# Patient Record
Sex: Male | Born: 1995 | Race: White | Hispanic: No | Marital: Single | Smoking: Never smoker
Health system: Southern US, Community
[De-identification: ages and names within clinical notes are randomized; demographics above are authoritative.]

## PROBLEM LIST (undated history)

## (undated) DIAGNOSIS — K769 Liver disease, unspecified: Secondary | ICD-10-CM

## (undated) DIAGNOSIS — I63511 Cerebral infarction due to unspecified occlusion or stenosis of right middle cerebral artery: Secondary | ICD-10-CM

## (undated) DIAGNOSIS — Q224 Congenital tricuspid stenosis: Secondary | ICD-10-CM

## (undated) DIAGNOSIS — I629 Nontraumatic intracranial hemorrhage, unspecified: Secondary | ICD-10-CM

## (undated) DIAGNOSIS — Z7682 Awaiting organ transplant status: Secondary | ICD-10-CM

## (undated) DIAGNOSIS — Q248 Other specified congenital malformations of heart: Secondary | ICD-10-CM

## (undated) DIAGNOSIS — N186 End stage renal disease: Secondary | ICD-10-CM

## (undated) DIAGNOSIS — D649 Anemia, unspecified: Secondary | ICD-10-CM

## (undated) DIAGNOSIS — Z950 Presence of cardiac pacemaker: Secondary | ICD-10-CM

## (undated) DIAGNOSIS — I495 Sick sinus syndrome: Secondary | ICD-10-CM

## (undated) DIAGNOSIS — I129 Hypertensive chronic kidney disease with stage 1 through stage 4 chronic kidney disease, or unspecified chronic kidney disease: Secondary | ICD-10-CM

## (undated) DIAGNOSIS — R1310 Dysphagia, unspecified: Secondary | ICD-10-CM

## (undated) HISTORY — PX: KIDNEY TRANSPLANT: SHX239

## (undated) HISTORY — PX: APPENDECTOMY: SHX54

---

## 2013-08-30 ENCOUNTER — Ambulatory Visit: Payer: Self-pay | Admitting: Family Medicine

## 2020-03-03 ENCOUNTER — Other Ambulatory Visit: Payer: Self-pay

## 2020-03-03 ENCOUNTER — Encounter: Payer: Self-pay | Admitting: Emergency Medicine

## 2020-03-03 ENCOUNTER — Ambulatory Visit (INDEPENDENT_AMBULATORY_CARE_PROVIDER_SITE_OTHER): Payer: Medicare Other

## 2020-03-03 ENCOUNTER — Ambulatory Visit
Admission: EM | Admit: 2020-03-03 | Discharge: 2020-03-03 | Disposition: A | Discharge status: Home / Self Care | Payer: Medicare Other | Attending: Emergency Medicine | Admitting: Emergency Medicine

## 2020-03-03 DIAGNOSIS — S66912A Strain of unspecified muscle, fascia and tendon at wrist and hand level, left hand, initial encounter: Secondary | ICD-10-CM

## 2020-03-03 DIAGNOSIS — IMO0001 Reserved for inherently not codable concepts without codable children: Secondary | ICD-10-CM

## 2020-03-03 HISTORY — DX: Cerebral infarction due to unspecified occlusion or stenosis of right middle cerebral artery: I63.511

## 2020-03-03 HISTORY — DX: Presence of cardiac pacemaker: Z95.0

## 2020-03-03 HISTORY — DX: Congenital tricuspid stenosis: Q22.4

## 2020-03-03 HISTORY — DX: Dysphagia, unspecified: R13.10

## 2020-03-03 HISTORY — DX: Hypertensive chronic kidney disease with stage 1 through stage 4 chronic kidney disease, or unspecified chronic kidney disease: I12.9

## 2020-03-03 HISTORY — DX: Anemia, unspecified: D64.9

## 2020-03-03 HISTORY — DX: Nontraumatic intracranial hemorrhage, unspecified: I62.9

## 2020-03-03 HISTORY — DX: End stage renal disease: N18.6

## 2020-03-03 HISTORY — DX: Liver disease, unspecified: K76.9

## 2020-03-03 HISTORY — DX: Awaiting organ transplant status: Z76.82

## 2020-03-03 HISTORY — DX: Other specified congenital malformations of heart: Q24.8

## 2020-03-03 HISTORY — DX: Sick sinus syndrome: I49.5

## 2020-03-03 NOTE — ED Triage Notes (Signed)
Patient states that he fell off his hoverboard yesterday and injured his left 4th finger.  Patient has swelling, bruising and tenderness in his left fourth finger.

## 2020-03-03 NOTE — Discharge Instructions (Addendum)
Apply ice 20 minutes out of every 2 hours 4-5 times daily for comfort.  Elevate your hand above the level of your heart sufficiently to control swelling and pain.  If you are not improving in a week or 2 follow-up at  [facility]

## 2022-01-25 IMAGING — CR DG FINGER RING 2+V*L*
3 series · 3 of 3 positions shown · non-contrast
Comparison: None.

CLINICAL DATA: Pain and swelling involving the left fourth digit
after fall from hover board yesterday.

EXAM:
LEFT RING FINGER 2+V

[finger ap]
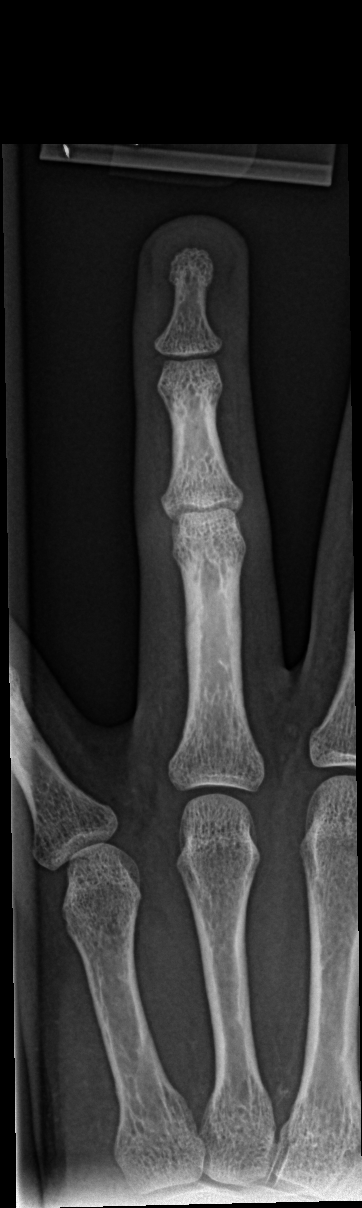

[finger obl]
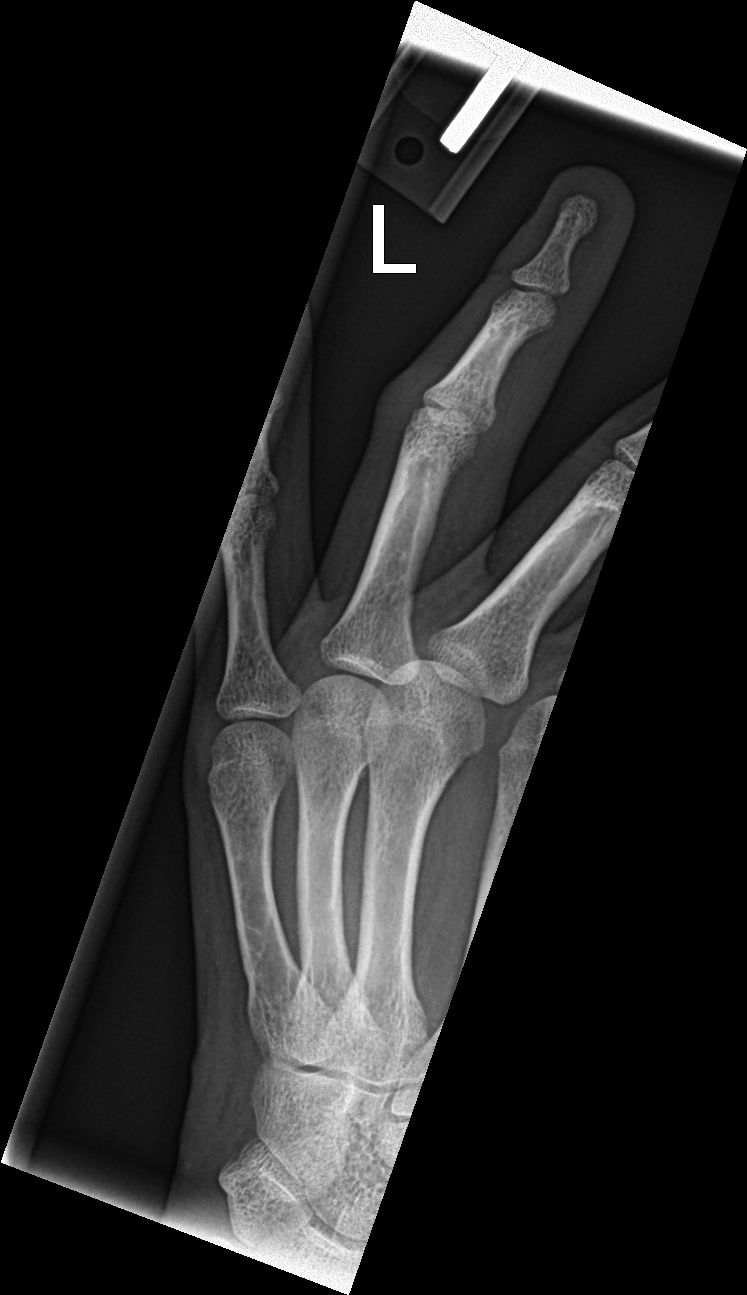

[finger lat]
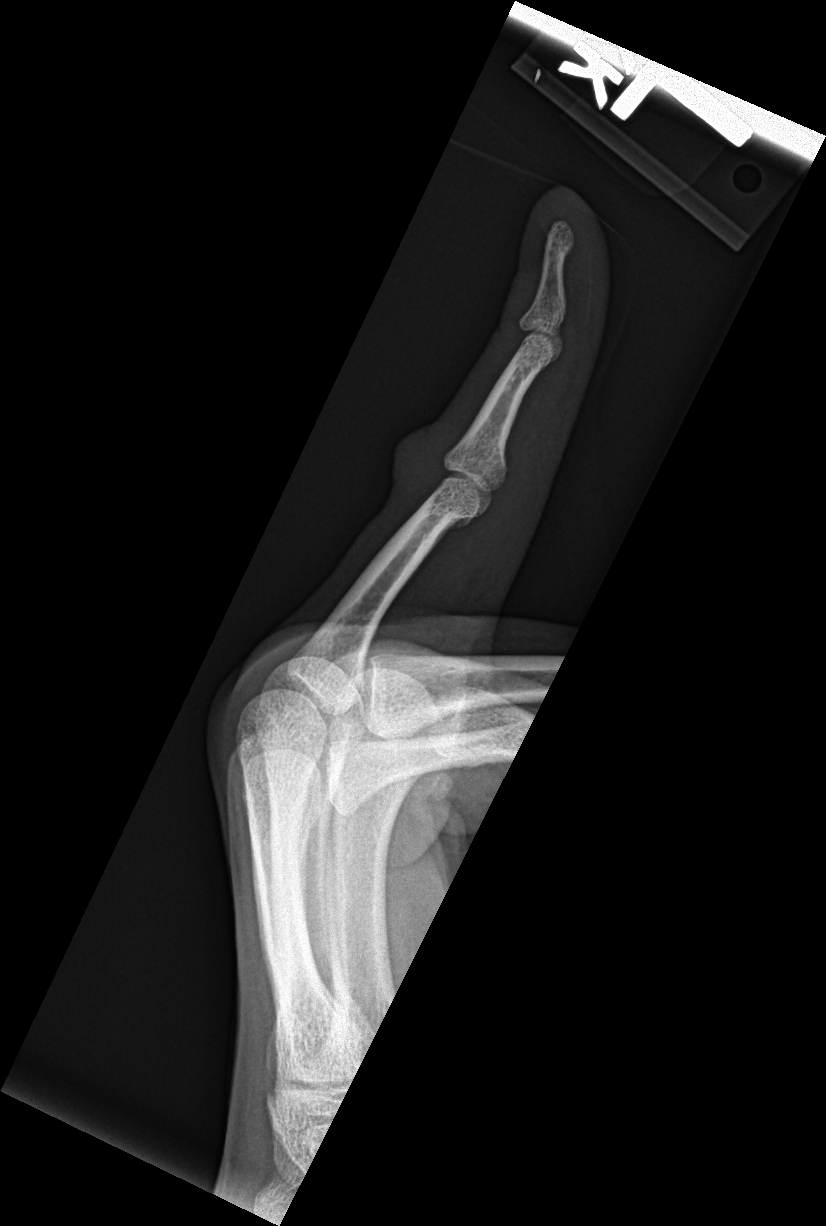

[3 of 3 positions shown; findings below may reference images not displayed]

FINDINGS: Mild soft tissue swelling about the PIP joint of the fourth digit
without associated fracture or dislocation. Joint spaces are
preserved. No erosions. No radiopaque foreign body.
IMPRESSION: Mild soft tissue swelling about the PIP joint of the fourth digit
without associated fracture, dislocation or radiopaque foreign body
